# Patient Record
Sex: Female | Born: 2000 | Race: Black or African American | Hispanic: No | Marital: Single | Smoking: Never smoker
Health system: Southern US, Community
[De-identification: ages and names within clinical notes are randomized; demographics above are authoritative.]

## PROBLEM LIST (undated history)

## (undated) ENCOUNTER — Emergency Department (HOSPITAL_COMMUNITY): Admission: EM | Payer: Medicaid Other | Source: Home / Self Care

---
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
## 2020-03-05 IMAGING — DX DG SCOLIOSIS EVAL COMPLETE SPINE 2-3V
2 series · 7 of 7 positions shown · non-contrast
Comparison: None.

CLINICAL DATA: Adolescent idiopathic scoliosis. Chronic low back
pain when standing for a prolonged period of time period

EXAM:
DG SCOLIOSIS EVAL COMPLETE SPINE 2-3V

[Series 1: whole body ap · 0.14mm/px · 4 of 4 slices shown]
[im 1/4]
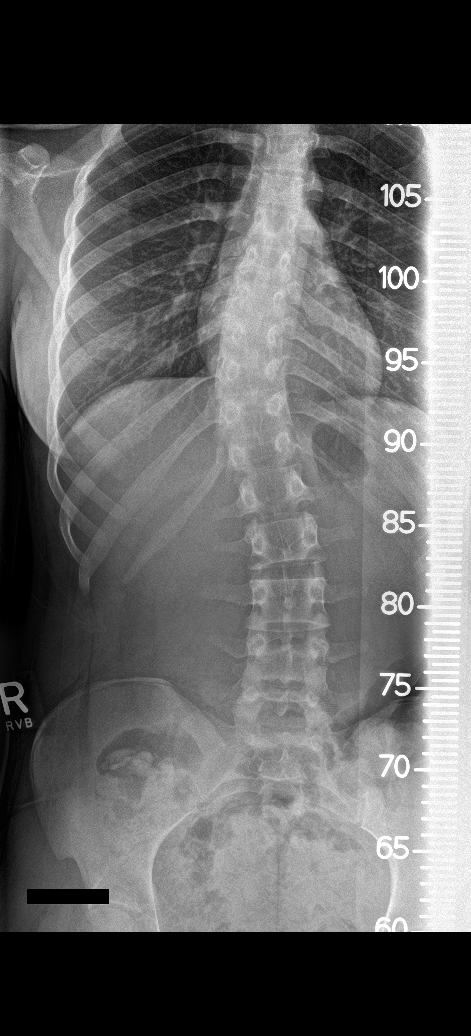
[im 2/4]
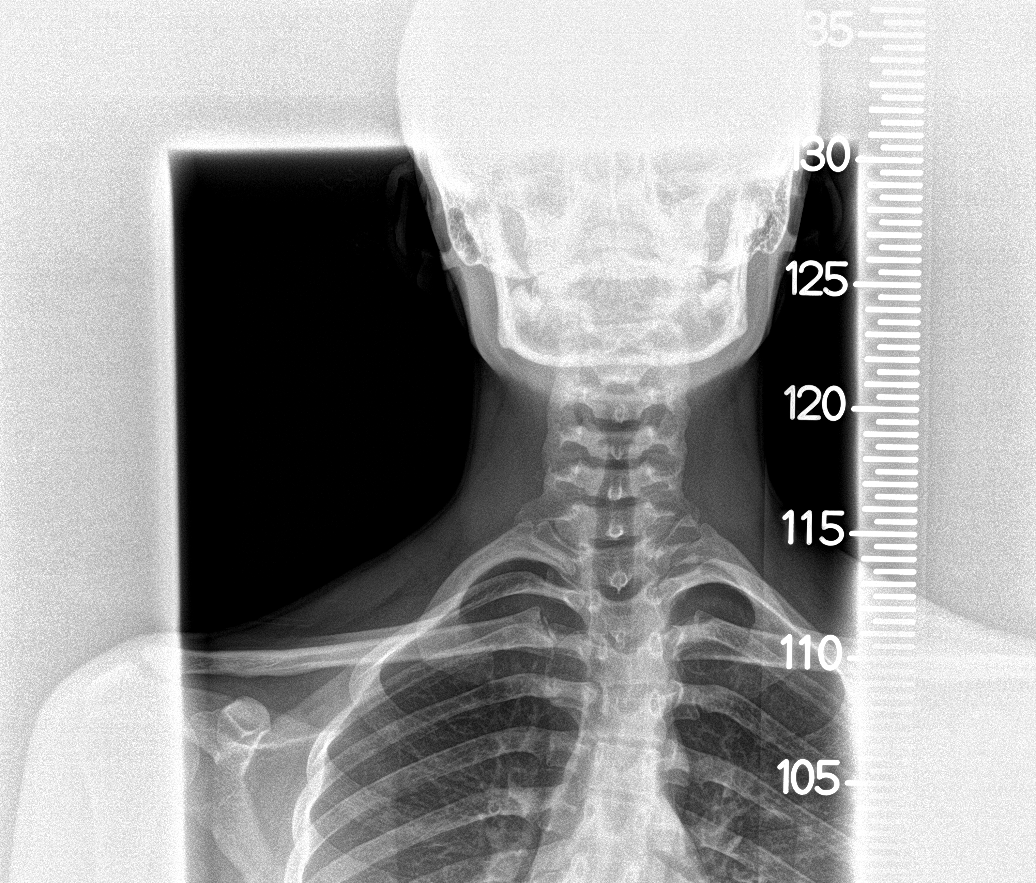
[im 3/4]
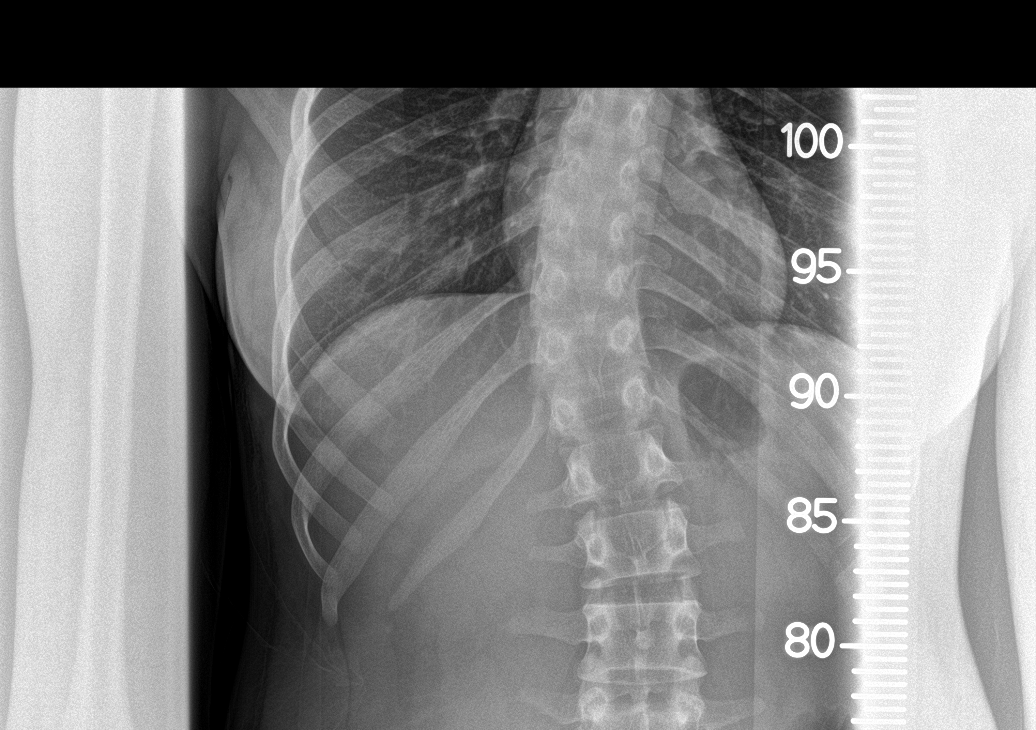
[im 4/4]
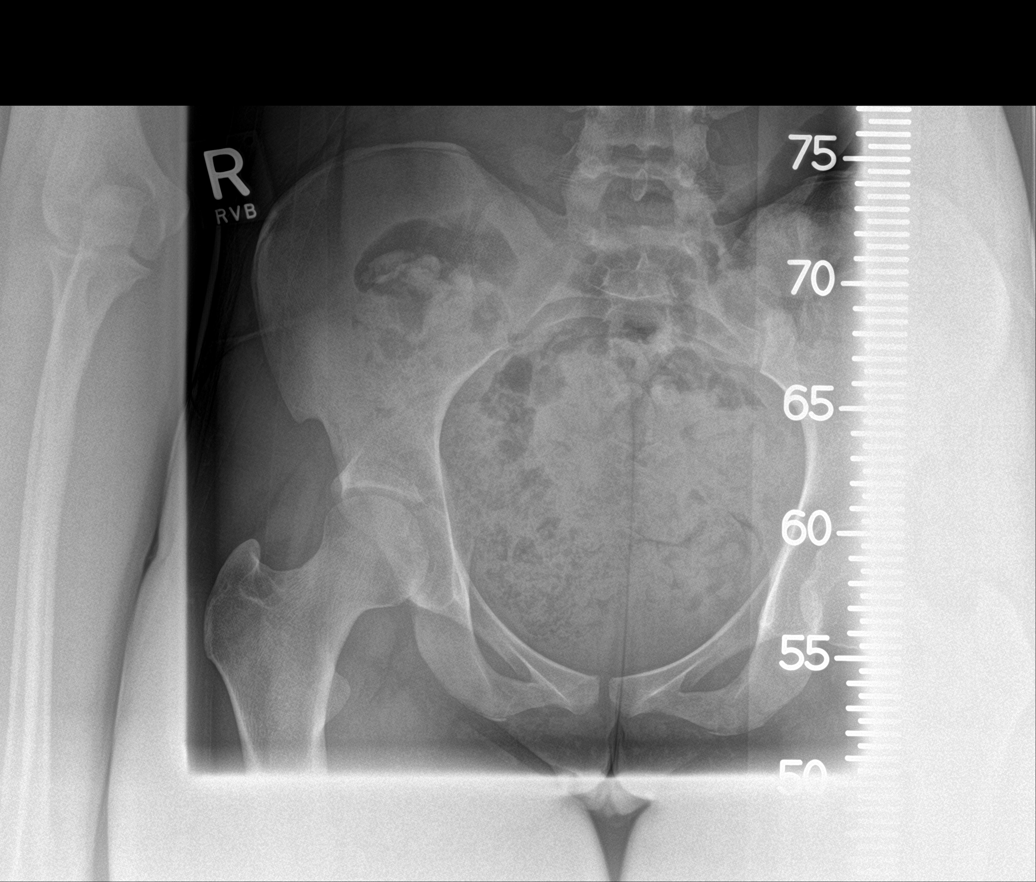

[Series 2: whole body lat · 0.14mm/px · 3 of 3 slices shown]
[im 1/3]
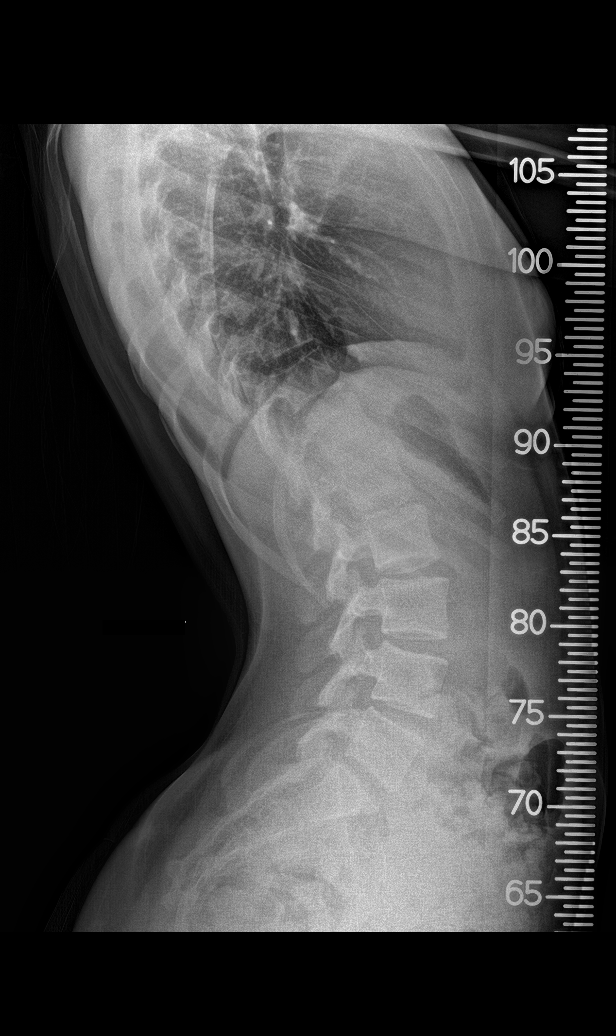
[im 2/3]
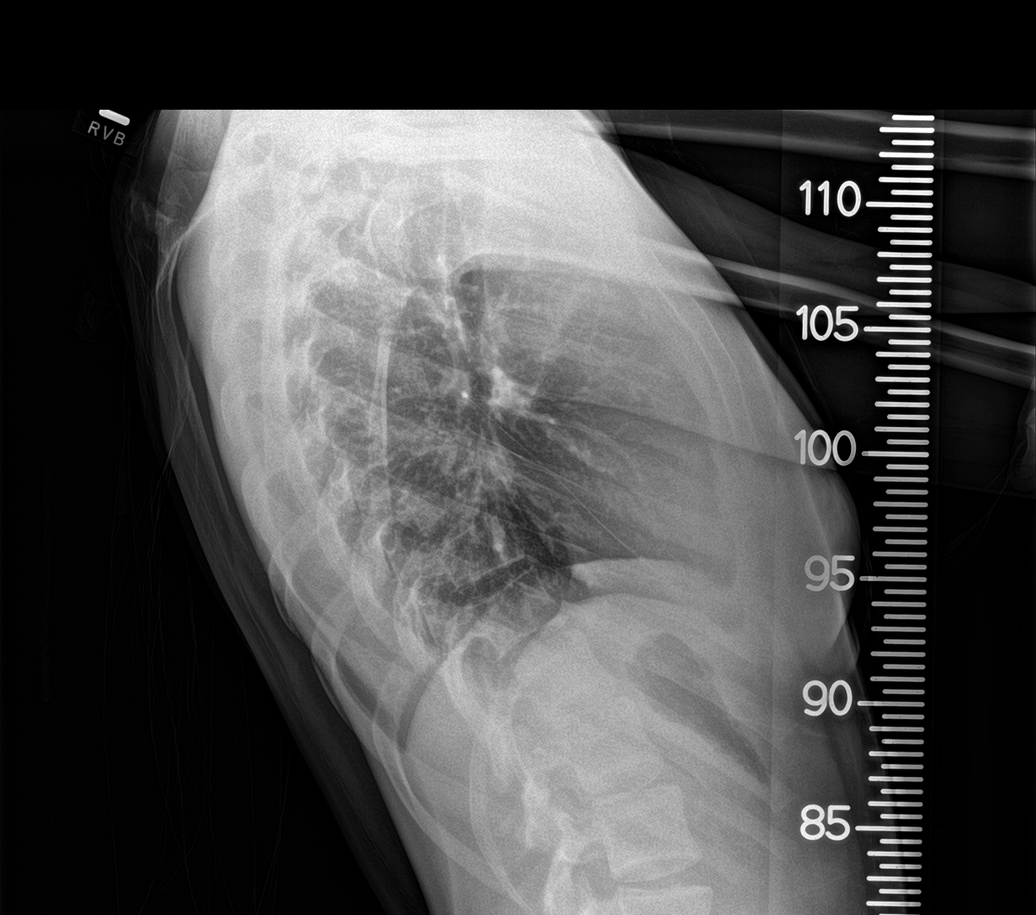
[im 3/3]
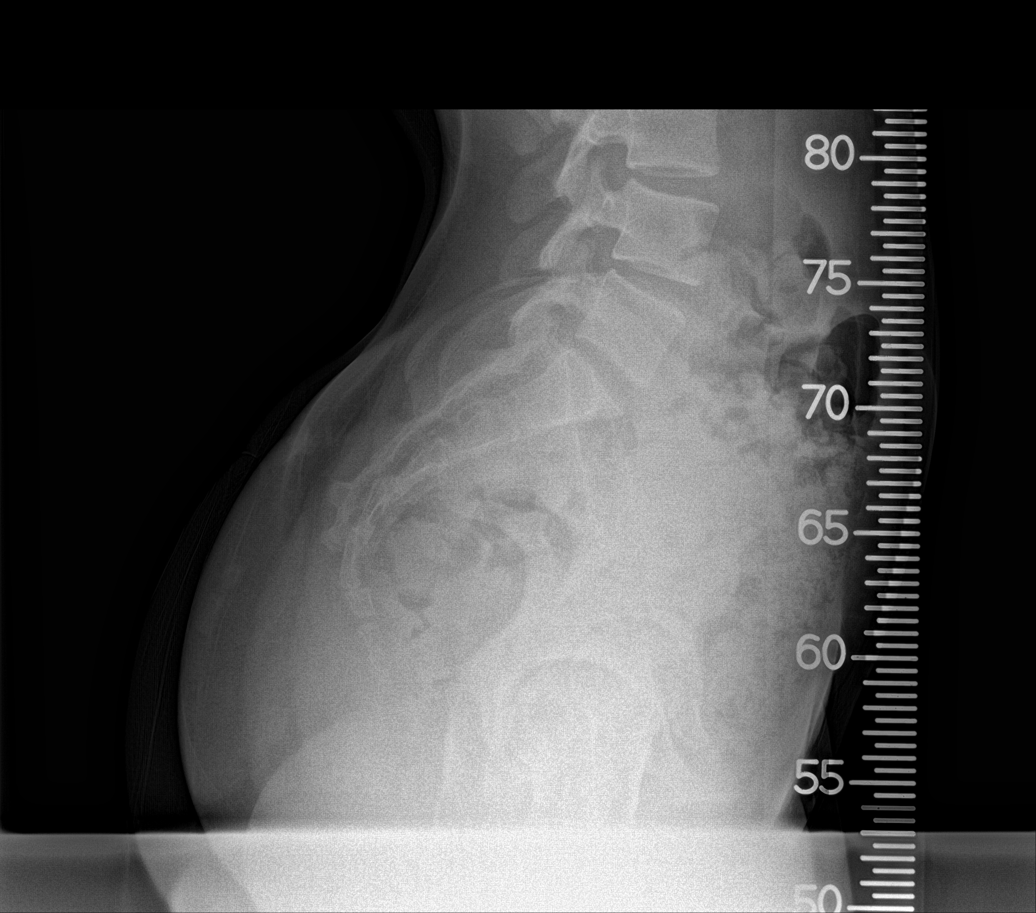

[7 of 7 positions shown; findings below may reference images not displayed]

FINDINGS: Thoracic dextroscoliosis measuring approximately 25 degrees
(measured from the UPPER endplate of T6 through the UPPER endplate
of L1), with the apex of the curve at T10.

Compensatory lumbar levoscoliosis measuring approximately 21 degrees
(measured from the UPPER endplate of L1 through the UPPER endplate
of L5), with the apex of the compensatory curve at L3.

Anatomic alignment on the lateral image. Well-preserved disc spaces
throughout.
IMPRESSION: Thoracic dextroscoliosis with compensatory lumbar levoscoliosis,
measured above.

## 2020-11-23 ENCOUNTER — Encounter (HOSPITAL_COMMUNITY): Payer: Self-pay

## 2020-11-23 ENCOUNTER — Other Ambulatory Visit: Payer: Self-pay

## 2020-11-23 ENCOUNTER — Ambulatory Visit (HOSPITAL_COMMUNITY)
Admission: EM | Admit: 2020-11-23 | Discharge: 2020-11-23 | Disposition: A | Discharge status: Home / Self Care | Payer: Medicaid Other | Attending: Internal Medicine | Admitting: Internal Medicine

## 2020-11-23 DIAGNOSIS — Z20822 Contact with and (suspected) exposure to covid-19: Secondary | ICD-10-CM | POA: Diagnosis present

## 2020-11-23 DIAGNOSIS — J029 Acute pharyngitis, unspecified: Secondary | ICD-10-CM | POA: Diagnosis present

## 2020-11-23 LAB — POCT RAPID STREP A, ED / UC: Streptococcus, Group A Screen (Direct): NEGATIVE

## 2020-11-23 LAB — SARS CORONAVIRUS 2 (TAT 6-24 HRS): SARS Coronavirus 2: POSITIVE — AB

## 2020-11-23 NOTE — Discharge Instructions (Addendum)
You likely having a viral upper respiratory infection. We recommended symptom control. I expect your symptoms to start improving in the next 1-2 weeks.   1. Take a daily allergy pill/anti-histamine like Zyrtec, Claritin, or Store brand consistently for 2 weeks  2. For congestion you may try an oral decongestant like Mucinex or sudafed. You may also try intranasal flonase nasal spray or saline irrigations (neti pot, sinus cleanse)  3. For your sore throat you may try cepacol lozenges, salt water gargles, throat spray. Treatment of congestion may also help your sore throat.  4. For cough you may try Robitussen, Mucinex DM  5. Take Tylenol or Ibuprofen to help with pain/inflammation  6. Stay hydrated, drink plenty of fluids to keep throat coated and less irritated  Honey Tea For cough/sore throat try using a honey-based tea. Use 3 teaspoons of honey with juice squeezed from half lemon. Place shaved pieces of ginger into 1/2-1 cup of water and warm over stove top. Then mix the ingredients and repeat every 4 hours as needed.   Your rapid strep test was negative in urgent care.  Throat culture and COVID-19 viral swab are pending.  We will call if these are positive.

## 2020-11-23 NOTE — ED Triage Notes (Signed)
T reports scratchy throat x 1 day. Denies fever.   Pt requested COVID test.

## 2020-11-23 NOTE — ED Provider Notes (Signed)
[facility]    CSN: [id number] Arrival date & time: 11/23/20  1307      History   Chief Complaint Chief Complaint  Patient presents with   Sore Throat    HPI Vanessa Barker is a 20 y.o. female.   Patient presents with 1 day history of "scratchy throat".  Denies any upper respiratory symptoms or cough.  Denies any known fever.  Patient states that she had a COVID-19 exposure at camp a few days ago.  Patient is requesting a COVID test.   Sore Throat   History reviewed. No pertinent past medical history.  There are no problems to display for this patient.   History reviewed. No pertinent surgical history.  OB History   No obstetric history on file.      Home Medications    Prior to Admission medications   Not on File    Family History Family History  Problem Relation Age of Onset   Healthy Mother    Diabetes Father     Social History Social History   Tobacco Use   Smoking status: Never   Smokeless tobacco: Never  Vaping Use   Vaping Use: Never used  Substance Use Topics   Alcohol use: Never   Drug use: Never     Allergies   Patient has no known allergies.   Review of Systems Review of Systems Per HPI  Physical Exam Triage Vital Signs ED Triage Vitals  Enc Vitals Group     BP 11/23/20 1341 118/80     Pulse Rate 11/23/20 1341 93     Resp 11/23/20 1341 16     Temp 11/23/20 1341 97.7 F (36.5 C)     Temp Source 11/23/20 1341 Oral     SpO2 11/23/20 1341 97 %     Weight --      Height --      Head Circumference --      Peak Flow --      Pain Score 11/23/20 1340 6     Pain Loc --      Pain Edu? --      Excl. in GC? --    No data found.  Updated Vital Signs BP 118/80 (BP Location: Left Arm)   Pulse 93   Temp 97.7 F (36.5 C) (Oral)   Resp 16   LMP 11/19/2020 (Exact Date)   SpO2 97%   Visual Acuity Right Eye Distance:   Left Eye Distance:   Bilateral Distance:    Right Eye Near:   Left Eye Near:     Bilateral Near:     Physical Exam Constitutional:      General: She is not in acute distress.    Appearance: Normal appearance.  HENT:     Head: Normocephalic and atraumatic.     Right Ear: Tympanic membrane and ear canal normal.     Left Ear: Tympanic membrane and ear canal normal.     Nose: Mucosal edema and congestion present.     Right Turbinates: Enlarged.     Left Turbinates: Enlarged.     Mouth/Throat:     Mouth: Mucous membranes are moist.     Pharynx: Posterior oropharyngeal erythema present.  Eyes:     Extraocular Movements: Extraocular movements intact.     Conjunctiva/sclera: Conjunctivae normal.     Pupils: Pupils are equal, round, and reactive to light.  Cardiovascular:     Rate and Rhythm: Normal rate and regular rhythm.  Pulses: Normal pulses.     Heart sounds: Normal heart sounds.  Pulmonary:     Effort: Pulmonary effort is normal. No respiratory distress.     Breath sounds: Normal breath sounds. No wheezing, rhonchi or rales.  Abdominal:     General: Abdomen is flat. Bowel sounds are normal.     Palpations: Abdomen is soft.  Musculoskeletal:        General: Normal range of motion.     Cervical back: Normal range of motion.  Skin:    General: Skin is warm and dry.  Neurological:     General: No focal deficit present.     Mental Status: She is alert and oriented to person, place, and time. Mental status is at baseline.  Psychiatric:        Mood and Affect: Mood normal.        Behavior: Behavior normal.     UC Treatments / Results  Labs (all labs ordered are listed, but only abnormal results are displayed) Labs Reviewed  CULTURE, GROUP A STREP ([facility])  SARS CORONAVIRUS 2 (TAT 6-24 HRS)  POCT RAPID STREP A, ED / UC    EKG   Radiology No results found.  Procedures Procedures (including critical care time)  Medications Ordered in UC Medications - No data to display  Initial Impression / Assessment and Plan / UC Course  I have reviewed  the triage vital signs and the nursing notes.  Pertinent labs & imaging results that were available during my care of the patient were reviewed by me and considered in my medical decision making (see chart for details).     Suspect viral cause to symptoms due to COVID-19 exposure and appearance on physical exam.  Rapid strep test was negative in urgent care.  Throat culture and COVID-19 viral swab are pending.  Discussed over-the-counter treatment options for symptoms.Discussed strict return precautions. Patient verbalized understanding and is agreeable with plan.  Final Clinical Impressions(s) / UC Diagnoses   Final diagnoses:  Encounter for laboratory testing for COVID-19 virus  Close exposure to COVID-19 virus  Sore throat     Discharge Instructions      You likely having a viral upper respiratory infection. We recommended symptom control. I expect your symptoms to start improving in the next 1-2 weeks.   1. Take a daily allergy pill/anti-histamine like Zyrtec, Claritin, or Store brand consistently for 2 weeks  2. For congestion you may try an oral decongestant like Mucinex or sudafed. You may also try intranasal flonase nasal spray or saline irrigations (neti pot, sinus cleanse)  3. For your sore throat you may try cepacol lozenges, salt water gargles, throat spray. Treatment of congestion may also help your sore throat.  4. For cough you may try Robitussen, Mucinex DM  5. Take Tylenol or Ibuprofen to help with pain/inflammation  6. Stay hydrated, drink plenty of fluids to keep throat coated and less irritated  Honey Tea For cough/sore throat try using a honey-based tea. Use 3 teaspoons of honey with juice squeezed from half lemon. Place shaved pieces of ginger into 1/2-1 cup of water and warm over stove top. Then mix the ingredients and repeat every 4 hours as needed.   Your rapid strep test was negative in urgent care.  Throat culture and COVID-19 viral swab are pending.  We  will call if these are positive.     ED Prescriptions   None    PDMP not reviewed this encounter.   Henrene Dodge  E, FNP 11/23/20 1407

## 2020-11-25 LAB — CULTURE, GROUP A STREP (THRC)

## 2020-11-27 NOTE — Progress Notes (Signed)
Attempted to call patient. No answer. LVM.

## 2020-11-28 NOTE — Progress Notes (Signed)
Attempted to call patient again. No answer. 

## 2022-02-16 ENCOUNTER — Emergency Department (HOSPITAL_COMMUNITY): Payer: Medicaid Other

## 2022-02-16 ENCOUNTER — Other Ambulatory Visit: Payer: Self-pay

## 2022-02-16 ENCOUNTER — Encounter (HOSPITAL_COMMUNITY): Payer: Self-pay

## 2022-02-16 ENCOUNTER — Emergency Department (HOSPITAL_COMMUNITY)
Admission: EM | Admit: 2022-02-16 | Discharge: 2022-02-16 | Disposition: A | Discharge status: Home / Self Care | Payer: Medicaid Other | Attending: Emergency Medicine | Admitting: Emergency Medicine

## 2022-02-16 DIAGNOSIS — Y92410 Unspecified street and highway as the place of occurrence of the external cause: Secondary | ICD-10-CM | POA: Diagnosis not present

## 2022-02-16 DIAGNOSIS — R0789 Other chest pain: Secondary | ICD-10-CM | POA: Diagnosis not present

## 2022-02-16 DIAGNOSIS — M542 Cervicalgia: Secondary | ICD-10-CM | POA: Diagnosis present

## 2022-02-16 MED ORDER — IBUPROFEN 600 MG PO TABS: 600.0000 mg | ORAL_TABLET | Freq: Four times a day (QID) | ORAL | 0 refills | Status: AC | PRN

## 2022-02-16 MED ORDER — IBUPROFEN 800 MG PO TABS
800.0000 mg | ORAL_TABLET | Freq: Once | ORAL | Status: AC
  Administered 2022-02-16: 800 mg via ORAL
  Filled 2022-02-16: qty 1

## 2022-02-16 MED ORDER — METHOCARBAMOL 500 MG PO TABS: 500.0000 mg | ORAL_TABLET | Freq: Three times a day (TID) | ORAL | 0 refills | Status: AC | PRN
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
## 2022-02-16 NOTE — Discharge Instructions (Signed)
Your x-rays are negative for fractures.  Take Tylenol or Motrin as needed for your aches and pains.  Follow-up with your doctor.  Return to the ED with difficulty breathing, chest pain, abdominal pain, or other concerns.
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
